# Patient Record
Sex: Female | Born: 1970 | Race: White | Hispanic: No | Marital: Married
Health system: Southern US, Community
[De-identification: ages and names within clinical notes are randomized; demographics above are authoritative.]

---

## 2000-07-19 ENCOUNTER — Encounter: Payer: Self-pay | Admitting: Emergency Medicine

## 2000-07-19 ENCOUNTER — Emergency Department (HOSPITAL_COMMUNITY): Admission: EM | Admit: 2000-07-19 | Discharge: 2000-07-19 | Payer: Self-pay | Admitting: Emergency Medicine

## 2000-07-30 ENCOUNTER — Other Ambulatory Visit: Admission: RE | Admit: 2000-07-30 | Discharge: 2000-07-30 | Payer: Self-pay | Admitting: Obstetrics and Gynecology

## 2000-09-01 ENCOUNTER — Encounter: Admission: RE | Admit: 2000-09-01 | Discharge: 2000-11-30 | Payer: Self-pay | Admitting: Obstetrics and Gynecology

## 2001-03-11 ENCOUNTER — Inpatient Hospital Stay (HOSPITAL_COMMUNITY): Admission: AD | Admit: 2001-03-11 | Discharge: 2001-03-11 | Payer: Self-pay | Admitting: Obstetrics and Gynecology

## 2001-04-04 ENCOUNTER — Inpatient Hospital Stay (HOSPITAL_COMMUNITY): Admission: AD | Admit: 2001-04-04 | Discharge: 2001-04-07 | Payer: Self-pay | Admitting: Obstetrics and Gynecology

## 2001-05-12 ENCOUNTER — Other Ambulatory Visit: Admission: RE | Admit: 2001-05-12 | Discharge: 2001-05-12 | Payer: Self-pay | Admitting: Obstetrics and Gynecology

## 2002-05-10 ENCOUNTER — Other Ambulatory Visit: Admission: RE | Admit: 2002-05-10 | Discharge: 2002-05-10 | Payer: Self-pay | Admitting: Obstetrics and Gynecology

## 2003-06-11 ENCOUNTER — Inpatient Hospital Stay (HOSPITAL_COMMUNITY): Admission: AD | Admit: 2003-06-11 | Discharge: 2003-06-11 | Payer: Self-pay | Admitting: Obstetrics & Gynecology

## 2003-07-03 ENCOUNTER — Inpatient Hospital Stay (HOSPITAL_COMMUNITY): Admission: RE | Admit: 2003-07-03 | Discharge: 2003-07-05 | Payer: Self-pay | Admitting: Obstetrics and Gynecology

## 2003-08-15 ENCOUNTER — Other Ambulatory Visit: Admission: RE | Admit: 2003-08-15 | Discharge: 2003-08-15 | Payer: Self-pay | Admitting: Obstetrics and Gynecology

## 2004-09-09 ENCOUNTER — Other Ambulatory Visit: Admission: RE | Admit: 2004-09-09 | Discharge: 2004-09-09 | Payer: Self-pay | Admitting: Obstetrics and Gynecology

## 2004-10-16 ENCOUNTER — Ambulatory Visit (HOSPITAL_COMMUNITY): Admission: RE | Admit: 2004-10-16 | Discharge: 2004-10-16 | Payer: Self-pay | Admitting: Obstetrics and Gynecology

## 2013-05-26 ENCOUNTER — Ambulatory Visit: Payer: Self-pay

## 2014-04-24 ENCOUNTER — Other Ambulatory Visit: Payer: Self-pay | Admitting: Obstetrics and Gynecology

## 2014-04-25 LAB — CYTOLOGY - PAP

## 2014-06-13 ENCOUNTER — Ambulatory Visit: Payer: Self-pay

## 2014-06-13 ENCOUNTER — Ambulatory Visit (INDEPENDENT_AMBULATORY_CARE_PROVIDER_SITE_OTHER): Payer: BLUE CROSS/BLUE SHIELD | Admitting: Podiatry

## 2014-06-13 DIAGNOSIS — M779 Enthesopathy, unspecified: Secondary | ICD-10-CM

## 2014-06-13 DIAGNOSIS — M79671 Pain in right foot: Secondary | ICD-10-CM

## 2014-06-13 DIAGNOSIS — B351 Tinea unguium: Secondary | ICD-10-CM | POA: Diagnosis not present

## 2014-06-13 NOTE — Progress Notes (Signed)
Subjective:    Patient ID: Diane Ward, female    DOB: 1970/06/11, 44 y.o.   MRN: [id number]  HPI  44 year old female presents the office today with concerns her right foot and ankle pain which has been ongoing for the last 3 weeks. She states that the pain and swelling is intermittent. She has been elevating, using ice, and anti-inflammatories which seem to help. She says that today she is not having symptoms. She does state that pain started after wearing sandals as the weather became warmer. Denies any history of injury or trauma. She also states that her right hallux toenails been discolored and thickened. She's been using over-the-counter treatment for nail fungus. She states that since starting the medication she is redness is on a redness around the nail borders. She states there does not hurt there is no drainage. Denies any red streaks. No other complaints at this time.  Review of Systems  Musculoskeletal: Positive for joint swelling.  All other systems reviewed and are negative.      Objective:   Physical Exam AAO x3, NAD DP/PT pulses palpable bilaterally, CRT less than 3 seconds Protective sensation intact with Simms Weinstein monofilament, vibratory sensation intact, Achilles tendon reflex intact At this time there is no tenderness to palpation of bilateral lower extremities. Subjectively along the medial aspect of the right foot on the course of posterior tibial tendon inferior to the medial malleolus on the insertion into the navicular tuberosity she states this where she has some of her pain although is not at this time. Also upon palpation of the tibialis anterior and the extensor tendons on the medial aspect of the ankle she gets some subjective tenderness over this area however get this time is not tender. There is no areas of pinpoint bony tenderness or pain the vibratory sensation of the foot/ankle. There is no overlying edema, erythema, increase in warmth bilaterally.  There  is an increase in medial arch height upon weightbearing. MMT 5/5, ROM WNL.  Right hallux toe nails hypertrophic, dystrophic, discolored and brittle. There is no tenderness to palpation along the nail. There is a faint rim of erythema directly around the nail border. There is redness appears to be irritation from the medication as opposed to infection. There is no drainage to the toe is no ascending cellulitis. No open lesions or pre-ulcerative lesions.  No overlying edema, erythema, increase in warmth to bilateral lower extremities.  No pain with calf compression, swelling, warmth, erythema bilaterally.      Assessment & Plan:  44 year old female with right ankle pain, likely onychomycosis right hallux toenail -X-rays were obtained and reviewed with the patient.  -Treatment options discussed including all alternatives, risks, and complications -Discussed likely etiology of her ankle pain. It seems that this pain started after she started wearing a flat sandal. I believe that the pain is due to this causing a tendinitis. I recommended her to try and observe what shoes she is wearing to see if this makes a difference. She does recall that she started wearing a flat sandal when the pain started. Continue ice/elevation/anti-inflammatories prn. She did not want a stronger NSAID -In regards to the possible nail fungus and redness this is likely due to irritation from the medication she is applying of the posterior infection. However discussed that her sinuses infection to monitor for any others any to call the office immediately or go to the ER. Hold off on over-the-counter treatments at this time until symptoms resolve. Also the  nail was biopsied and sent to [facility] for evaluation of possible onychomycosis.  -Follow-up after nail biopsy or sooner should any problems arise or if symptoms worsen. In the meantime, encouraged to call the office with any questions/concerns/change in symptoms.

## 2014-07-10 ENCOUNTER — Telehealth: Payer: Self-pay | Admitting: *Deleted

## 2014-07-10 NOTE — Telephone Encounter (Addendum)
Pt's [name] culture of 06/13/2014 reviewed by Dr. Ardelle AntonWagoner, positive for fungus and pt needs an appt to discuss treatment.  Pt called states is scheduled 07/30/2014, but would like to know if Dr Ardelle AntonWagoner would give her a different  medication, since a doctor prescribed an antifungal in April that didn't work.  I encouraged pt to keep the 07/30/2014 appt to discuss all options with Dr. Ardelle AntonWagoner.

## 2014-07-23 ENCOUNTER — Telehealth: Payer: Self-pay | Admitting: *Deleted

## 2014-07-23 NOTE — Telephone Encounter (Signed)
Pt states she had a fungal culture 1 week ago, and the toe is sore.  I encouraged pt to begin Epsom Salt warm water soaks and cover with a bandaid until seen.  Pt agreed and I will have a scheduler call pt 07/24/2014.

## 2014-07-24 ENCOUNTER — Encounter: Payer: Self-pay | Admitting: Podiatry

## 2014-07-25 ENCOUNTER — Encounter: Payer: Self-pay | Admitting: Podiatry

## 2014-07-25 ENCOUNTER — Ambulatory Visit (INDEPENDENT_AMBULATORY_CARE_PROVIDER_SITE_OTHER): Payer: BLUE CROSS/BLUE SHIELD | Admitting: Podiatry

## 2014-07-25 VITALS — BP 131/85 | HR 76 | Resp 16

## 2014-07-25 DIAGNOSIS — B351 Tinea unguium: Secondary | ICD-10-CM | POA: Diagnosis not present

## 2014-07-25 DIAGNOSIS — Z79899 Other long term (current) drug therapy: Secondary | ICD-10-CM

## 2014-07-25 LAB — CBC WITH DIFFERENTIAL/PLATELET
BASOS ABS: 0 10*3/uL (ref 0.0–0.1)
BASOS PCT: 0 % (ref 0–1)
EOS ABS: 0.2 10*3/uL (ref 0.0–0.7)
Eosinophils Relative: 3 % (ref 0–5)
HCT: 40.1 % (ref 36.0–46.0)
Hemoglobin: 13.6 g/dL (ref 12.0–15.0)
LYMPHS ABS: 1.4 10*3/uL (ref 0.7–4.0)
Lymphocytes Relative: 23 % (ref 12–46)
MCH: 29.1 pg (ref 26.0–34.0)
MCHC: 33.9 g/dL (ref 30.0–36.0)
MCV: 85.7 fL (ref 78.0–100.0)
MPV: 10.7 fL (ref 8.6–12.4)
Monocytes Absolute: 0.5 10*3/uL (ref 0.1–1.0)
Monocytes Relative: 9 % (ref 3–12)
NEUTROS ABS: 3.8 10*3/uL (ref 1.7–7.7)
NEUTROS PCT: 65 % (ref 43–77)
Platelets: 233 10*3/uL (ref 150–400)
RBC: 4.68 MIL/uL (ref 3.87–5.11)
RDW: 13.6 % (ref 11.5–15.5)
WBC: 5.9 10*3/uL (ref 4.0–10.5)

## 2014-07-25 LAB — HEPATIC FUNCTION PANEL
ALK PHOS: 54 U/L (ref 39–117)
ALT: 18 U/L (ref 0–35)
AST: 16 U/L (ref 0–37)
Albumin: 3.8 g/dL (ref 3.5–5.2)
BILIRUBIN INDIRECT: 0.3 mg/dL (ref 0.2–1.2)
BILIRUBIN TOTAL: 0.4 mg/dL (ref 0.2–1.2)
Bilirubin, Direct: 0.1 mg/dL (ref 0.0–0.3)
Total Protein: 6.7 g/dL (ref 6.0–8.3)

## 2014-07-25 MED ORDER — ITRACONAZOLE 100 MG PO CAPS: 100.0000 mg | ORAL_CAPSULE | Freq: Two times a day (BID) | ORAL | Status: DC

## 2014-07-25 NOTE — Patient Instructions (Signed)
Itraconazole capsules and tablets What is this medicine? ITRACONAZOLE (i tra KO na zole) is an antifungal medicine. It is used to treat certain kinds of fungal or yeast infections. This medicine may be used for other purposes; ask your health care provider or pharmacist if you have questions. COMMON BRAND NAME(S): ONMEL, Sporanox What should I tell my health care provider before I take this medicine? They need to know if you have any of these conditions: -heart disease, including angina or heart failure -history of irregular heartbeat -kidney disease or on dialysis -liver disease -lung disease -an unusual or allergic reaction to itraconazole, or other antifungal medicines, foods, dyes or preservatives -pregnant or trying to get pregnant -breast-feeding How should I use this medicine? Take this medicine by mouth with a full glass of water. Follow the directions on the prescription label. Take after eating a full meal. If you have a condition called achlorhydria, are taking H2-receptor antagonists or other gastric acid suppressors, you should take this medicine with a cola beverage. Take your doses at regular intervals. Do not take your medicine more often than directed. Do not stop taking except on your doctor's advice. Talk to your pediatrician regarding the use of this medicine in children. Special care may be needed. Overdosage: If you think you have taken too much of this medicine contact a poison control center or emergency room at once. NOTE: This medicine is only for you. Do not share this medicine with others. What if I miss a dose? If you miss a dose, take it as soon as you can. If it is almost time for your next dose, take only that dose. Do not take double or extra doses. What may interact with this medicine? Do not take this medicine with any of the following medications: -alfuzosin -cisapride -colchicine -ergot alkaloids like dihydroergotamine, ergonovine, ergotamine,  methylergonovine -methadone -nevirapine -pimozide -red yeast rice -sirolimus -some medicines for anxiety or sleep like alprazolam, clorazepate, flurazepam, midazolam, triazolam -some medicines for blood pressure like felodipine and nisoldipine -some medicines for cancer treatment like irinotecan -some medicines for cholesterol like atorvastatin, cerivastatin, lovastatin, simvastatin -some medicines for the heart like conivaptan, disopyramide, dofetilide, dronedarone, eplerenone, quinidine, ranolazine -some medicines for psychotic disturbances like lurasidone This medicine may also interact with the following medications: -alfentanil -antacids -antiviral medicines for HIV or AIDS -budesonide -buspirone -cilostazol -cyclosporine -digoxin -eletriptan -erythromycin -fentanyl -fluticasone -halofantrine -medicines for blood pressure like amlodipine and nifedipine -medicines for stomach problems like cimetidine, famotidine, omeprazole, lansoprazole -medicines for tuberculosis like isoniazid, INH, rifabutin, rifampin, rifapentine -medicines that treat or prevent blood clots like warfarin -methylprednisolone -other medicines for fungal infections -phenytoin, fosphenytoin -some medicines for diabetes -tacrolimus -trimetrexate This list may not describe all possible interactions. Give your health care provider a list of all the medicines, herbs, non-prescription drugs, or dietary supplements you use. Also tell them if you smoke, drink alcohol, or use illegal drugs. Some items may interact with your medicine. What should I watch for while using this medicine? Visit your doctor or health care professional for check ups. Tell your doctor or healthcare professional if your symptoms do not start to get better or if they get worse. Some fungal infections can take many weeks or months of treatment to cure. Avoid medicines for your stomach like antacids, anticholinergics, and acid blockers for at  least 2 hours after taking this medicine. You may get dizzy or blurred/double vision when taking this medicine. Do not drive, use machinery, or do anything that may be dangerous   until you know how the medicine affects you. Do not stand or sit up quickly, especially if you are an older patient. What side effects may I notice from receiving this medicine? Side effects that you should report to your doctor or health care professional as soon as possible: -allergic reactions like skin rash, itching or hives, swelling of the face, lips, or tongue -breathing problems -changes in hearing -cough up mucus -dark urine -fast, irregular heartbeat -general ill feeling or flu-like symptoms -light-colored stools -loss of appetite -nausea, vomiting -pain, tingling, numbness in the hands or feet -redness, blistering, peeling or loosening of the skin, including inside the mouth -right upper belly pain -sudden weight gain -swelling in feet, ankles, or legs -unusually weak or tired -yellowing of the eyes or skin Side effects that usually do not require medical attention (report to your doctor or health care professional if they continue or are bothersome): -blurred/double vision -diarrhea -dizziness -headache -stomach upset or bloating This list may not describe all possible side effects. Call your doctor for medical advice about side effects. You may report side effects to FDA at 1-800-FDA-1088. Where should I keep my medicine? Keep out of the reach of children. Store at room temperature between 15 and 25 degrees C (59 and 77 degrees F). Protect from light and moisture. Throw away any unused medicine after the expiration date. NOTE: This sheet is a summary. It may not cover all possible information. If you have questions about this medicine, talk to your doctor, pharmacist, or health care provider.  2015, Elsevier/Gold Standard. (2012-07-04 17:02:46)  

## 2014-07-27 NOTE — Progress Notes (Signed)
Patient ID: Diane Ward, female   DOB: 08/03/1970, 44 y.o.   MRN: [id number]  Subjective: 44 year old female presents the operative discussed nail biopsy results. She says that she called the on-call doctor over the weekend that she was having some pain to her toenails however that has resolved. She also states that since last appointment the pain to her right foot and ankle is completely resolved with shoe gear changes. She denies any redness or drainage from the nail sites. Denies any swelling or redness to her foot or ankle. No other complaints at this time.  Objective: AAO 3, NAD Neurovascular status unchanged Right hallux nails hypertrophic, dystrophic, discolored. There is no tenderness to palpation overlying the nail. There is no swelling erythema or drainage. There is no pain to palpation overlying the right foot or ankle. There is no overlying edema, erythema, increase in warmth bilaterally. No areas of tenderness to bilateral lower extremities. No open lesions or pre-ulcerative lesions No calf Compression, swelling, warmth, erythema.  Assessment: 44 year old female right hallux onychomycosis; resolved ankle pain  Plan: Nail biopsy results were discussed with the patient. Discussed. Gaynelle Arabian options. This time she'll proceed oral treatment. A prescription for itraconazole was sent to the patients pharmacy as well as a prescription for blood work was given to her. Discussed that if the blood work first before proceeding with treatment. We will call the results before starting treatment. Discussed side effects the medication for which she understands. She verbally understood this. Continue to monitor for recurrence of right foot pain. Follow-up in 4 weeks or sooner if any palms are to arise. Call with questions or concerns in the meantime.  Ovid Curd, DPM

## 2014-07-30 ENCOUNTER — Ambulatory Visit: Payer: BLUE CROSS/BLUE SHIELD | Admitting: Podiatry

## 2014-08-07 ENCOUNTER — Telehealth: Payer: Self-pay | Admitting: *Deleted

## 2014-08-07 NOTE — Telephone Encounter (Signed)
Completed prior authorization form faxed to [facility] dx B35.1 and copy of labs and office note of 06/13/2014.

## 2014-08-09 ENCOUNTER — Telehealth: Payer: Self-pay | Admitting: *Deleted

## 2014-08-09 NOTE — Telephone Encounter (Signed)
[employer] faxed approval notice Reference# [id number].  I left message on pt's phone informing of the medication approval and faxed to [facility] [phone].

## 2014-08-17 ENCOUNTER — Telehealth: Payer: Self-pay | Admitting: *Deleted

## 2014-08-17 MED ORDER — ITRACONAZOLE 200 MG PO TABS: 1.0000 | ORAL_TABLET | Freq: Every day | ORAL | Status: DC

## 2014-08-17 NOTE — Telephone Encounter (Signed)
Can you send onmel to [facility]. Thanks.

## 2014-08-17 NOTE — Telephone Encounter (Addendum)
Pt states the medication Dr. Ardelle Anton prescribed is $400.00/30 days and her lab cost $300.00 out of pocket, is there a less expensive medication.  Left message informing pt of the change by Dr. Ardelle Anton and the change in pharmacy and to expect a call.  Onmel  #30 take 1 tablet daily until gone.

## 2014-08-22 ENCOUNTER — Telehealth: Payer: Self-pay | Admitting: *Deleted

## 2014-08-22 NOTE — Telephone Encounter (Signed)
Pt states she has not received a call from the other pharmacy for the new medication.  I called and left the [facility] phone [phone].

## 2014-08-27 ENCOUNTER — Ambulatory Visit: Payer: BLUE CROSS/BLUE SHIELD | Admitting: Podiatry

## 2014-10-22 ENCOUNTER — Telehealth: Payer: Self-pay | Admitting: *Deleted

## 2014-10-22 MED ORDER — ITRACONAZOLE 200 MG PO TABS: 1.0000 | ORAL_TABLET | Freq: Every day | ORAL | Status: AC

## 2014-10-22 NOTE — Telephone Encounter (Signed)
Pt request refill of Onmel.  Dr. Ardelle AntonWagoner refilled +1additional, sent to [facility].  Called pt and gave [facility] contact number.

## 2015-03-13 ENCOUNTER — Telehealth: Payer: Self-pay | Admitting: *Deleted

## 2015-03-13 NOTE — Telephone Encounter (Signed)
Pt states she is having redness, pain and swelling in the right 1st toe, the same one that had a fungus last summer.  I left message encouraging pt to make an appt and until seen by a doctor, perform Epsom salt soaks 1/2 C epsom salt to 1 qt warm water for 20 minutes then cover with a antibacterial ointment bandaid.

## 2015-07-16 ENCOUNTER — Other Ambulatory Visit: Payer: Self-pay | Admitting: Obstetrics and Gynecology

## 2015-07-16 DIAGNOSIS — R928 Other abnormal and inconclusive findings on diagnostic imaging of breast: Secondary | ICD-10-CM

## 2015-07-19 ENCOUNTER — Ambulatory Visit
Admission: RE | Admit: 2015-07-19 | Discharge: 2015-07-19 | Disposition: A | Discharge status: Home / Self Care | Payer: BLUE CROSS/BLUE SHIELD | Source: Ambulatory Visit | Attending: Obstetrics and Gynecology | Admitting: Obstetrics and Gynecology

## 2015-07-19 DIAGNOSIS — R928 Other abnormal and inconclusive findings on diagnostic imaging of breast: Secondary | ICD-10-CM

## 2018-12-06 ENCOUNTER — Other Ambulatory Visit: Payer: Self-pay

## 2018-12-06 DIAGNOSIS — Z20822 Contact with and (suspected) exposure to covid-19: Secondary | ICD-10-CM

## 2018-12-09 LAB — NOVEL CORONAVIRUS, NAA: SARS-CoV-2, NAA: NOT DETECTED

## 2019-03-13 ENCOUNTER — Other Ambulatory Visit: Payer: Self-pay | Admitting: Obstetrics and Gynecology

## 2019-03-13 DIAGNOSIS — N6001 Solitary cyst of right breast: Secondary | ICD-10-CM

## 2019-03-23 ENCOUNTER — Other Ambulatory Visit: Payer: Self-pay

## 2019-03-23 ENCOUNTER — Ambulatory Visit
Admission: RE | Admit: 2019-03-23 | Discharge: 2019-03-23 | Disposition: A | Discharge status: Home / Self Care | Payer: BLUE CROSS/BLUE SHIELD | Source: Ambulatory Visit | Attending: Obstetrics and Gynecology | Admitting: Obstetrics and Gynecology

## 2019-03-23 ENCOUNTER — Ambulatory Visit
Admission: RE | Admit: 2019-03-23 | Discharge: 2019-03-23 | Disposition: A | Discharge status: Home / Self Care | Payer: 59 | Source: Ambulatory Visit | Attending: Obstetrics and Gynecology | Admitting: Obstetrics and Gynecology

## 2019-03-23 DIAGNOSIS — N6001 Solitary cyst of right breast: Secondary | ICD-10-CM

## 2020-06-29 IMAGING — MG MM DIGITAL DIAGNOSTIC UNILAT*R* IMPLANT W/ TOMO W/ CAD
8 series · 8 of 20 positions shown · non-contrast
Comparison: Previous exam(s).

CLINICAL DATA: 48-year-old female presenting for evaluation of a
palpable lump in the right breast.

EXAM:
2D DIGITAL DIAGNOSTIC RIGHT MAMMOGRAM WITH IMPLANTS, CAD AND ADJUNCT
TOMO
The patient has retropectoral implants. Standard and implant
displaced views were performed.
RIGHT BREAST ULTRASOUND

[R CC]
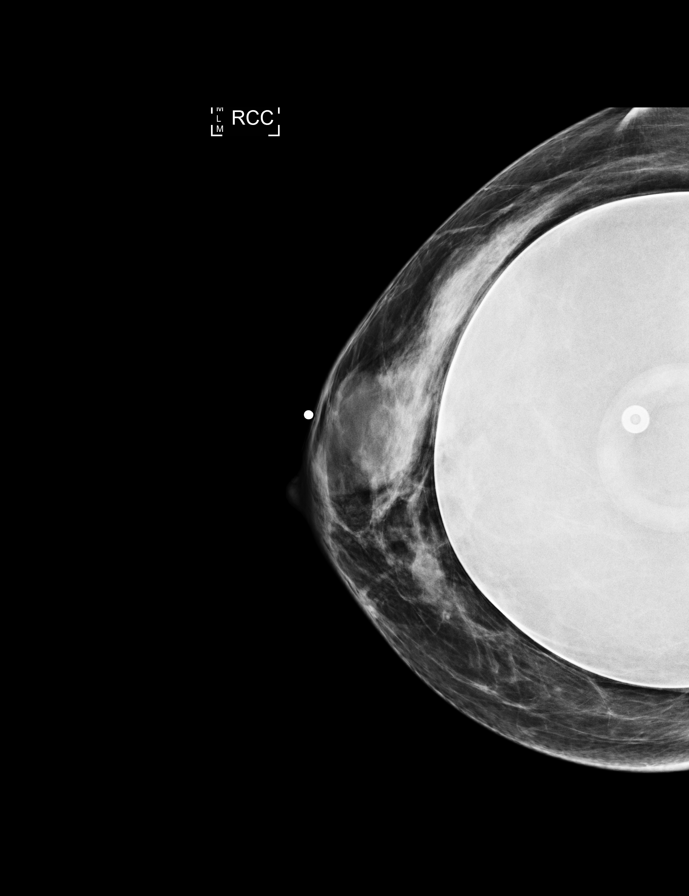

[R MLO]
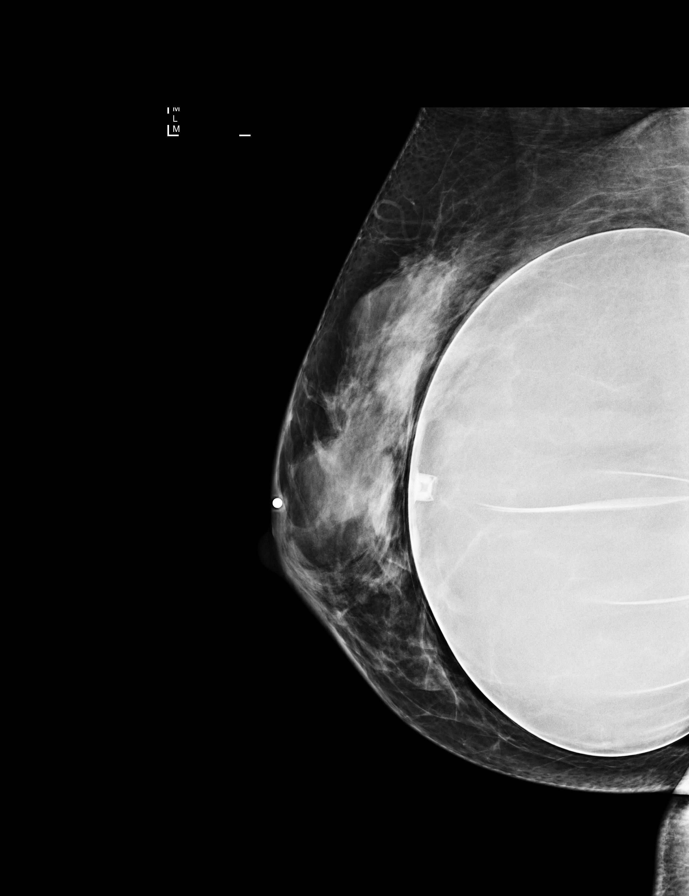

[R TAN synth-2D]
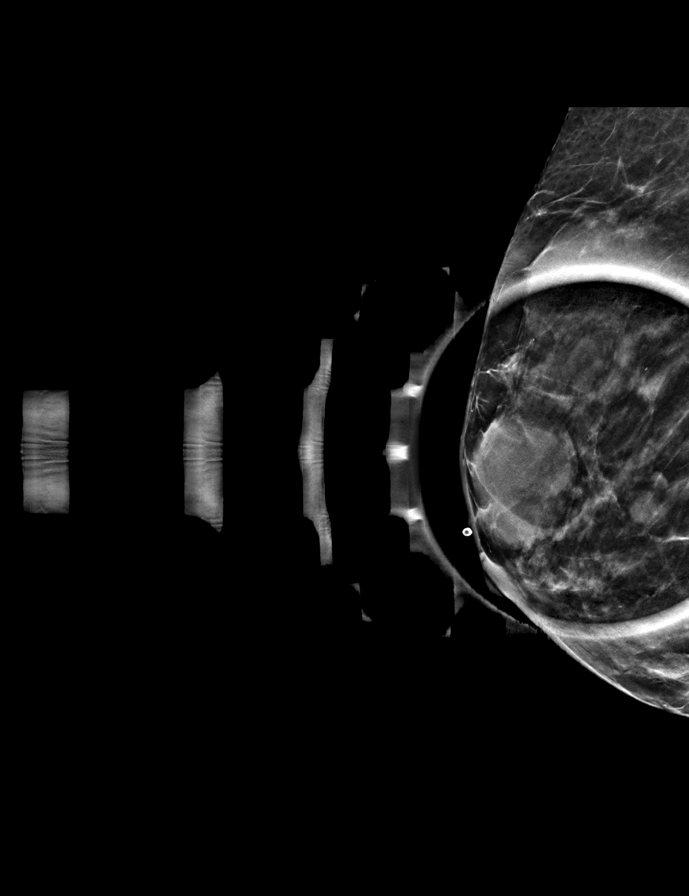

[R CC synth-2D]
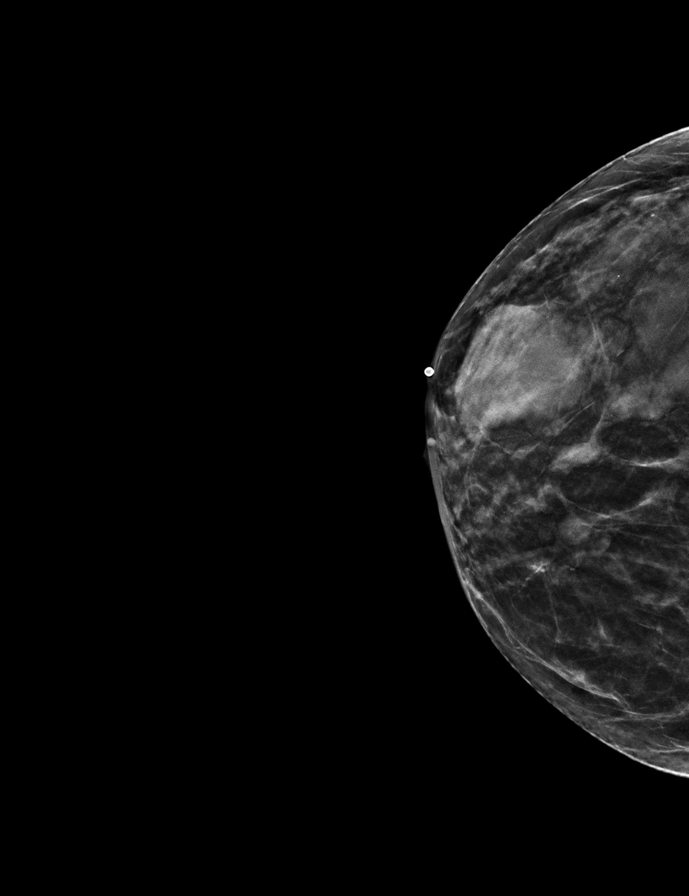

[R MLO synth-2D]
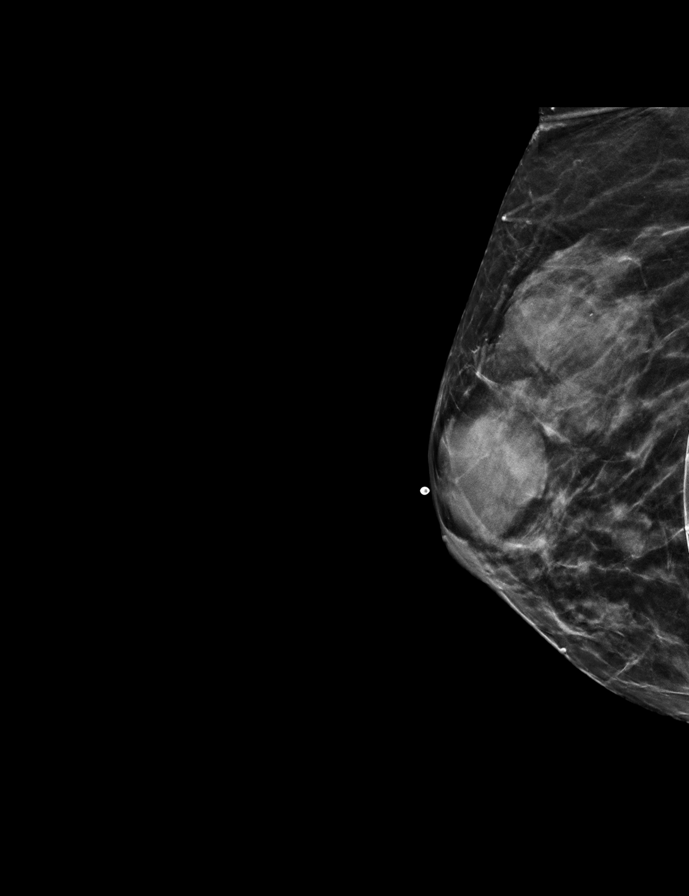

[R CCID BREAST TOMOSYNTHESIS IMAGE tomo · tomo slice 20/39.0]
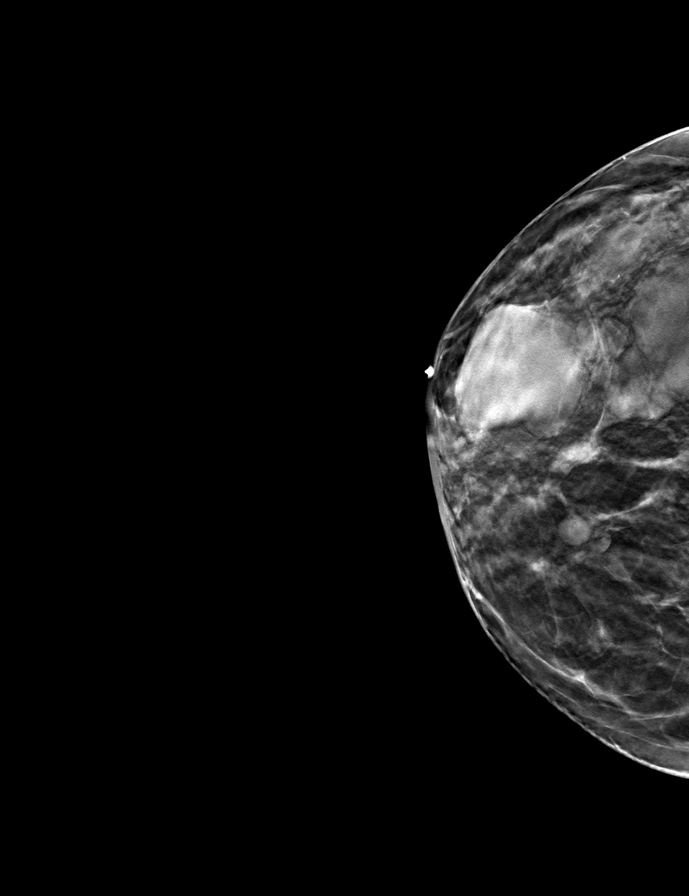

[R MLOID BREAST TOMOSYNTHESIS IMAGE tomo · tomo slice 25/48.0]
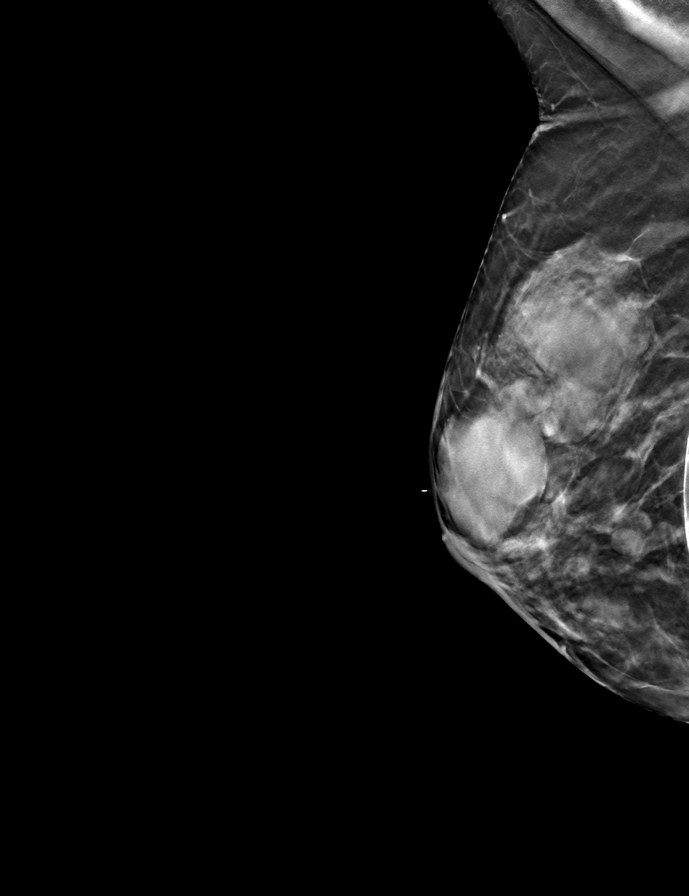

[R TAN tomo · tomo slice 23/44.0]
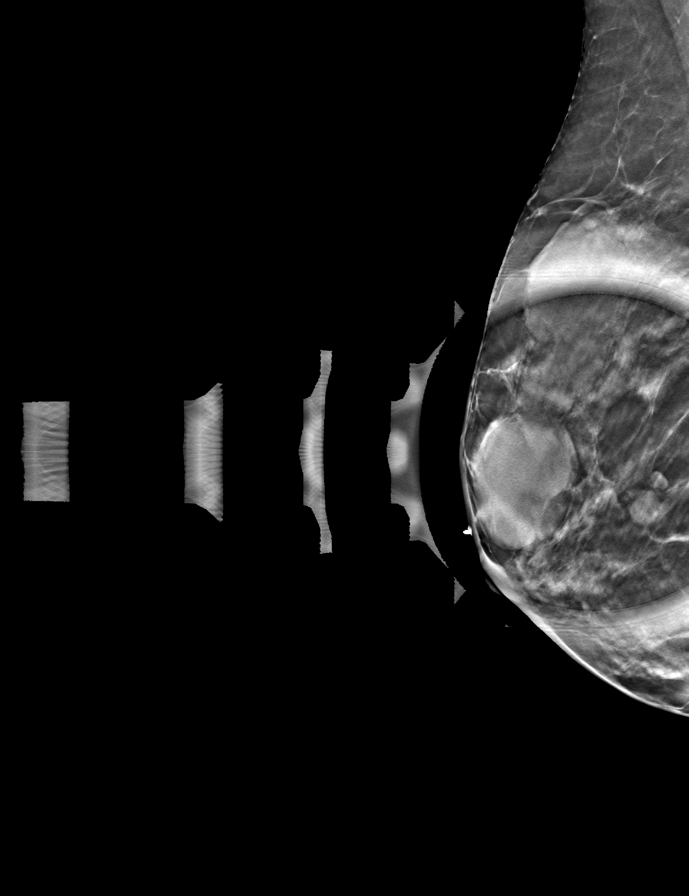

[8 of 20 positions shown; findings below may reference images not displayed]

ACR Breast Density Category c: The breast tissue is heterogeneously
dense, which may obscure small masses.
FINDINGS: A BB indicating the palpable site of concern has been placed on the
upper-outer anterior aspect of the right breast. Deep to the
palpable marker is an oval circumscribed hypoechoic mass. Additional
obscured in circumscribed masses are seen more distal to the nipple
in the upper-outer quadrant.

Mammographic images were processed with CAD.

Ultrasound targeted to the right breast at 11 o'clock, 2 cm from the
nipple demonstrates an anechoic oval circumscribed mass with
increased posterior acoustic enhancement measuring 2.9 x 1.8 x
cm. Multiple other similar appearing masses are seen in the
upper-outer quadrant, consistent with benign cysts.
IMPRESSION: The palpable mass in the right breast corresponds with a benign
cyst. Multiple other benign cysts are seen in the upper-outer right
breast.

RECOMMENDATION:
1. Ultrasound guided aspiration was offered, but declined by the
patient at this time. She will wait and see if her symptoms improve.
She will let us know if the pain worsens and she changes her mind
with regard aspiration.

2. Return to routine screening mammography is recommended. The
patient will be due for screening in Sunday October, 2019.

I have discussed the findings and recommendations with the patient.
If applicable, a reminder letter will be sent to the patient
regarding the next appointment.

BI-RADS CATEGORY  2: Benign.

## 2021-10-01 ENCOUNTER — Other Ambulatory Visit: Payer: Self-pay | Admitting: Obstetrics and Gynecology

## 2021-10-01 DIAGNOSIS — N6310 Unspecified lump in the right breast, unspecified quadrant: Secondary | ICD-10-CM

## 2021-10-13 ENCOUNTER — Ambulatory Visit
Admission: RE | Admit: 2021-10-13 | Discharge: 2021-10-13 | Disposition: A | Discharge status: Home / Self Care | Payer: 59 | Source: Ambulatory Visit | Attending: Obstetrics and Gynecology | Admitting: Obstetrics and Gynecology

## 2021-10-13 DIAGNOSIS — N6310 Unspecified lump in the right breast, unspecified quadrant: Secondary | ICD-10-CM
